# Patient Record
Sex: Female | Born: 1980 | Race: White | Hispanic: No | Marital: Married | Smoking: Current every day smoker
Health system: Southern US, Community
[De-identification: ages and names within clinical notes are randomized; demographics above are authoritative.]

---

## 2015-11-12 ENCOUNTER — Other Ambulatory Visit: Payer: Self-pay | Admitting: Orthopedic Surgery

## 2015-11-12 DIAGNOSIS — M4726 Other spondylosis with radiculopathy, lumbar region: Secondary | ICD-10-CM

## 2015-11-18 ENCOUNTER — Ambulatory Visit
Admission: RE | Admit: 2015-11-18 | Discharge: 2015-11-18 | Disposition: A | Discharge status: Home / Self Care | Payer: 59 | Source: Ambulatory Visit | Attending: Orthopedic Surgery | Admitting: Orthopedic Surgery

## 2015-11-18 DIAGNOSIS — M4726 Other spondylosis with radiculopathy, lumbar region: Secondary | ICD-10-CM

## 2018-10-07 ENCOUNTER — Other Ambulatory Visit: Payer: Self-pay | Admitting: Rehabilitation

## 2018-10-07 DIAGNOSIS — M5126 Other intervertebral disc displacement, lumbar region: Secondary | ICD-10-CM

## 2018-10-23 ENCOUNTER — Ambulatory Visit
Admission: RE | Admit: 2018-10-23 | Discharge: 2018-10-23 | Disposition: A | Discharge status: Home / Self Care | Payer: 59 | Source: Ambulatory Visit | Attending: Rehabilitation | Admitting: Rehabilitation

## 2018-10-23 DIAGNOSIS — M5126 Other intervertebral disc displacement, lumbar region: Secondary | ICD-10-CM

## 2020-03-22 ENCOUNTER — Other Ambulatory Visit: Payer: Self-pay | Admitting: Orthopaedic Surgery

## 2020-03-22 DIAGNOSIS — M47816 Spondylosis without myelopathy or radiculopathy, lumbar region: Secondary | ICD-10-CM

## 2020-04-21 ENCOUNTER — Ambulatory Visit
Admission: RE | Admit: 2020-04-21 | Discharge: 2020-04-21 | Disposition: A | Discharge status: Home / Self Care | Payer: 59 | Source: Ambulatory Visit | Attending: Orthopaedic Surgery | Admitting: Orthopaedic Surgery

## 2020-04-21 ENCOUNTER — Other Ambulatory Visit: Payer: Self-pay

## 2020-04-21 DIAGNOSIS — M47816 Spondylosis without myelopathy or radiculopathy, lumbar region: Secondary | ICD-10-CM

## 2022-04-13 IMAGING — MR MR LUMBAR SPINE W/O CM
4 of 5 series · 23 of 48 positions shown · non-contrast
Comparison: Prior MRI from 10/23/2018.

CLINICAL DATA: Initial evaluation for lower back pain radiating
into the left lower extremity with intermittent left buttock and
foot numbness.

EXAM:
MRI LUMBAR SPINE WITHOUT CONTRAST
TECHNIQUE: Multiplanar, multisequence MR imaging of the lumbar spine was
performed. No intravenous contrast was administered.

[Series 3: T2 post-contrast · sagittal · 4.0mm · 0.55mm/px · 7 of 13 slices shown]
[im 1/13]
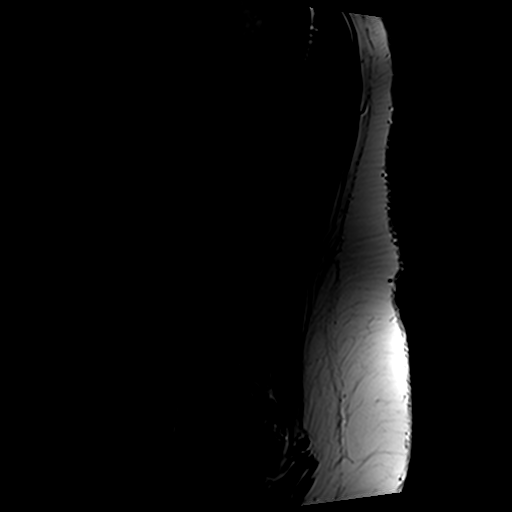
[im 3/13]
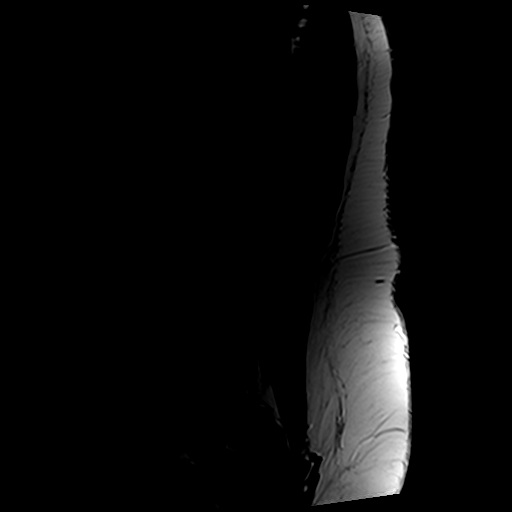
[im 5/13]
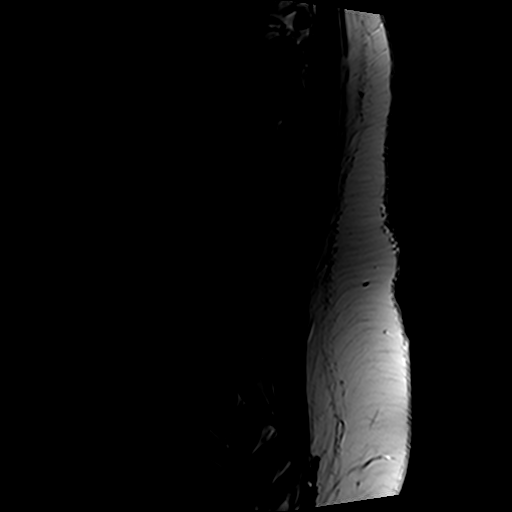
[im 7/13]
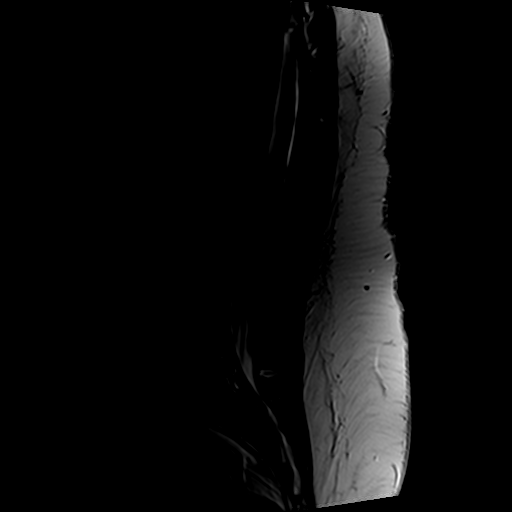
[im 9/13]
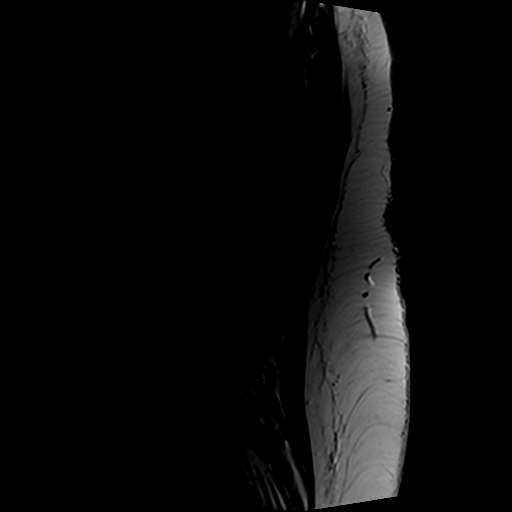
[im 11/13]
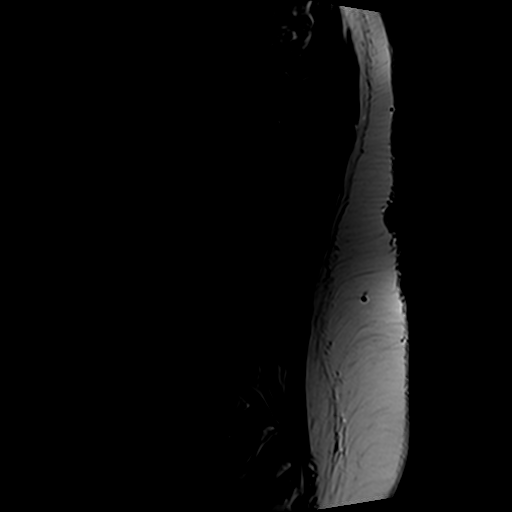
[im 13/13]
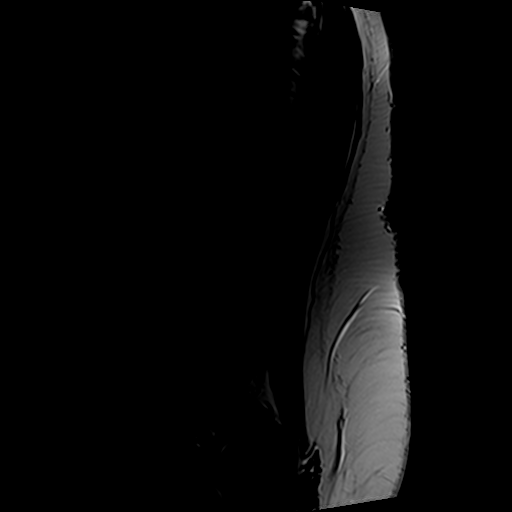

[Series 5: T1 · sagittal · 4.0mm · 0.55mm/px · 5 of 13 slices shown (1 of 2)]
[im 1/13]
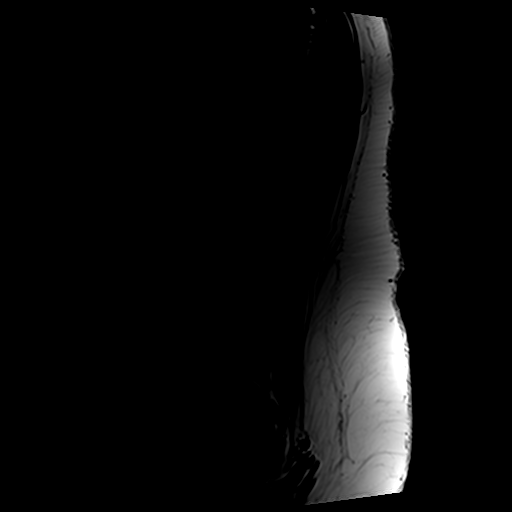
[im 3/13]
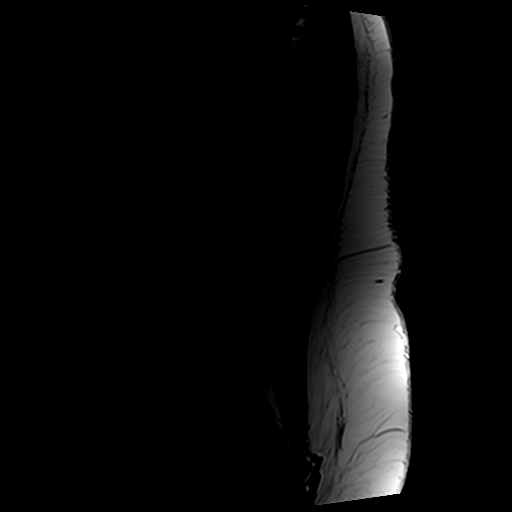
[im 5/13]
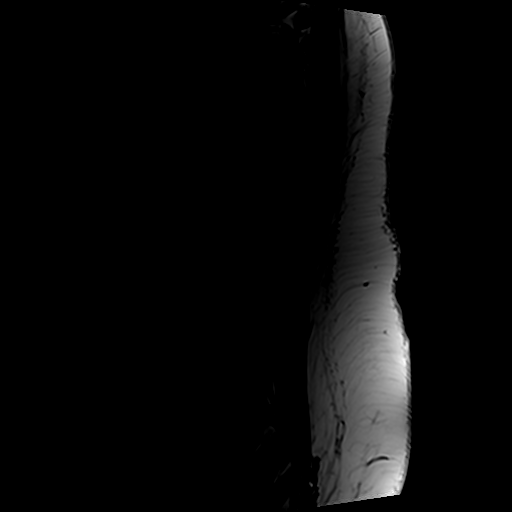
[im 8/13]
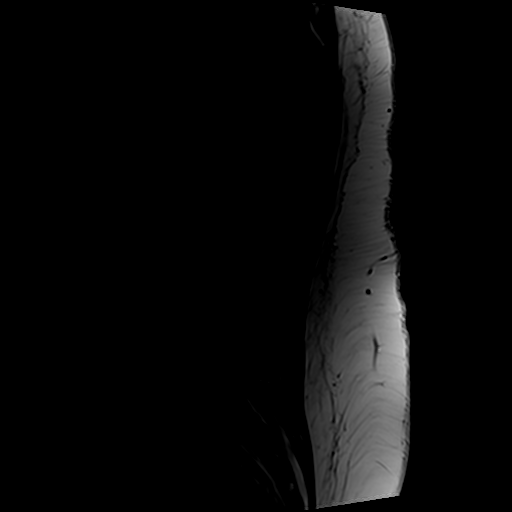
[im 13/13]
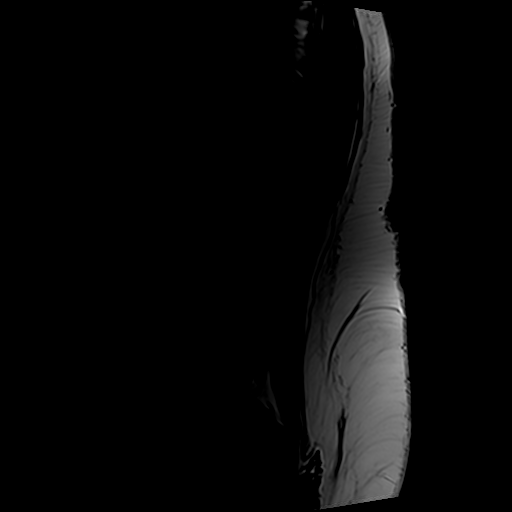

[Series 6: T1 · axial · 4.0mm · 0.35mm/px · z∈[-68,+46]mm · 3 of 29 slices shown (2 of 2)]
[im 5/29]
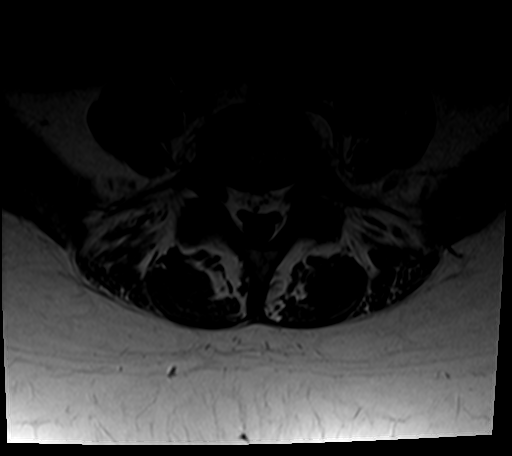
[im 16/29]
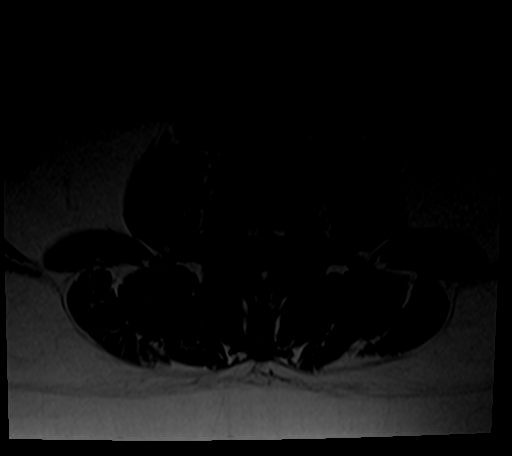
[im 24/29]
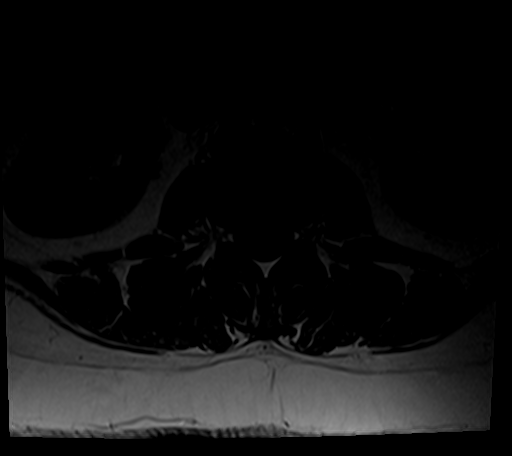

[Series 7: T2 · axial · 4.0mm · 0.70mm/px · z∈[-88,+79]mm · 8 of 29 slices shown]
[im 1/29]
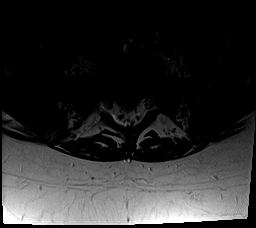
[im 5/29]
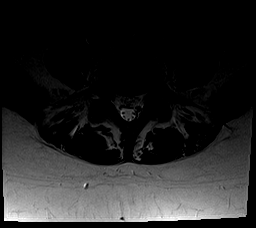
[im 9/29]
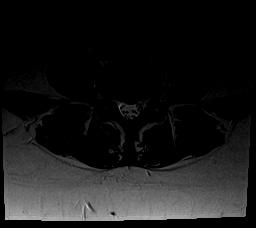
[im 13/29]
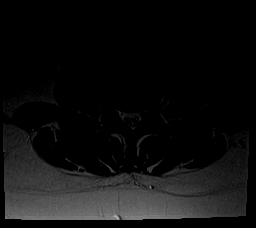
[im 16/29]
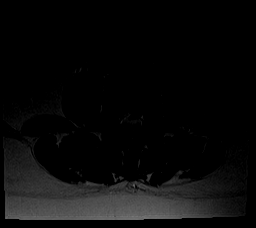
[im 20/29]
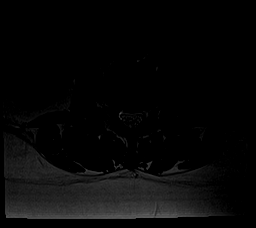
[im 24/29]
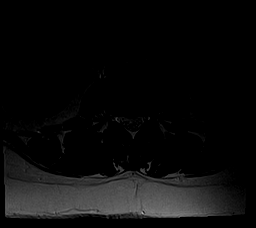
[im 29/29]
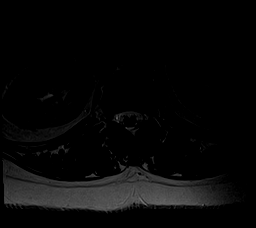

[23 of 48 positions shown; findings below may reference images not displayed]

FINDINGS: Segmentation: Standard. Lowest well-formed disc space labeled the
L5-S1 level.

Alignment: Trace retrolisthesis of L2 on L3, L3 on L4, and L4 on L5,
chronic and degenerative. Alignment otherwise normal with
preservation of the normal lumbar lordosis.

Vertebrae: Vertebral body height maintained without evidence for
acute or chronic fracture. Bone marrow signal intensity within
normal limits. Few scattered benign hemangiomata noted. No worrisome
osseous lesions. Prominent reactive endplate changes noted about the
L5-S1 interspace. No abnormal marrow edema.

Conus medullaris and cauda equina: Conus extends to the L1 level.
Conus and cauda equina appear normal.

Paraspinal and other soft tissues: Paraspinous soft tissues
demonstrate no acute finding. 2.6 cm heterogeneous T1/T2
hyperintense lesion seen within the left ovary (series 3, image 8),
indeterminate. Remainder the visualized visceral structures
otherwise unremarkable.

Disc levels:

L1-2: Diffuse disc bulge with disc desiccation. Superimposed small
central disc protrusion indents the ventral thecal sac. Associated
annular fissure. No significant spinal stenosis. Foramina remain
patent.

L2-3: Diffuse disc bulge with disc desiccation and intervertebral
disc space narrowing. Broad-based central disc protrusion indents
the ventral thecal sac. Borderline mild canal with right lateral
recess stenosis. Foramina remain patent.

L3-4: Diffuse disc bulge with disc desiccation. Shallow broad-based
central disc protrusion indents the ventral thecal sac, slightly
asymmetric to the right. Mild facet hypertrophy. Resultant mild
narrowing of the right lateral recess. Central canal remains patent.
Mild left L3 foraminal stenosis. No significant right foraminal
narrowing.

L4-5: Diffuse disc bulge with disc desiccation. Superimposed central
disc protrusion with annular fissure. Protruding disc closely
approximates the descending L5 nerve roots without frank neural
impingement. Mild bilateral facet hypertrophy. Resultant mild
narrowing of the lateral recesses. Mild to moderate bilateral L4
foraminal stenosis.

L5-S1: Chronic intervertebral disc space narrowing with diffuse disc
bulge and disc desiccation. Reactive endplate changes with marginal
endplate osteophytic spurring. Broad-based posterior disc bulge
closely approximates the descending S1 nerve roots without frank
neural impingement or displacement, slightly asymmetric to the left.
Mild facet hypertrophy. No significant spinal stenosis. Moderate
left worse than right L5 foraminal narrowing.
IMPRESSION: 1. Broad left eccentric disc bulge at L5-S1, closely approximating
the descending S1 nerve roots without frank neural impingement or
displacement.
2. Mild to moderate bilateral L4 and L5 foraminal stenosis related
to disc bulge and facet hypertrophy, greater on the left.
3. Additional small central disc protrusions at L1-2 through L4-5
without significant stenosis or neural impingement.
4. 2.6 cm heterogeneous T1/T2 hyperintense left adnexal lesion,
indeterminate. Further assessment with dedicated pelvic ultrasound
suggested for further evaluation.

## 2023-01-18 ENCOUNTER — Ambulatory Visit
Admission: RE | Admit: 2023-01-18 | Discharge: 2023-01-18 | Disposition: A | Discharge status: Home / Self Care | Payer: 59 | Source: Ambulatory Visit

## 2023-01-18 VITALS — BP 108/72 | HR 86 | Temp 99.9°F | Resp 18 | Ht <= 58 in | Wt 160.0 lb

## 2023-01-18 DIAGNOSIS — J111 Influenza due to unidentified influenza virus with other respiratory manifestations: Secondary | ICD-10-CM | POA: Diagnosis not present

## 2023-01-18 DIAGNOSIS — Z20828 Contact with and (suspected) exposure to other viral communicable diseases: Secondary | ICD-10-CM | POA: Diagnosis not present

## 2023-01-18 LAB — POCT INFLUENZA A/B
Influenza A, POC: NEGATIVE — AB
Influenza B, POC: NEGATIVE

## 2023-01-18 MED ORDER — PROMETHAZINE-DM 6.25-15 MG/5ML PO SYRP: 5.0000 mL | ORAL_SOLUTION | Freq: Four times a day (QID) | ORAL | 0 refills | Status: AC | PRN

## 2023-01-18 MED ORDER — OSELTAMIVIR PHOSPHATE 75 MG PO CAPS: 75.0000 mg | ORAL_CAPSULE | Freq: Two times a day (BID) | ORAL | 0 refills | Status: AC

## 2023-01-18 MED ORDER — IPRATROPIUM BROMIDE 0.06 % NA SOLN: 2.0000 | Freq: Four times a day (QID) | NASAL | 0 refills | Status: AC

## 2023-01-18 NOTE — ED Triage Notes (Signed)
Patient c/o possible influenza, son tested positive for influenza B on Thursday.  She woke up this morning with cough, body aches, congestion.  Patient has taken OTC cold meds.

## 2023-01-18 NOTE — Discharge Instructions (Signed)
I am treating you as if this is influenza even though your testing is negative.  Stop over-the-counter cold medicines, start Mucinex D, saline nasal irrigation with a Milta Deiters Med rinse and distilled water as often as you want, Atrovent nasal spray for the nasal congestion, Promethazine DM for the cough.  1000 mg of Tylenol 3 times a day.  Finish the Tamiflu, even if you feel better.

## 2023-01-18 NOTE — ED Provider Notes (Signed)
HPI  SUBJECTIVE:  Diana Jones is a 42 y.o. female who presents with fever Tmax 100.4, body aches, headaches, nasal congestion, rhinorrhea, sore throat, cough and chest soreness starting last night.  No postnasal drip, nausea,  vomiting, abdominal pain.  No known COVID exposure.  Her son currently has a lab confirmed case of influenza.  She did not get the COVID-vaccine.  She got this years flu vaccine.  She tried severe congestion/cough OTC medication and Tylenol without improvement in her symptoms.  Symptoms worse with being up. she has no past medical history, but cannot take NSAIDs.  LMP: Yesterday.  Denies possibility being pregnant. PCP: [facility] [place]   History reviewed. No pertinent past medical history.  History reviewed. No pertinent surgical history.  Family History  Problem Relation Age of Onset   Healthy Mother     Social History   Tobacco Use   Smoking status: Every Day    Types: Cigarettes   Smokeless tobacco: Never  Vaping Use   Vaping Use: Never used  Substance Use Topics   Alcohol use: Yes   Drug use: Never    No current facility-administered medications for this encounter.  Current Outpatient Medications:    gabapentin (NEURONTIN) 800 MG tablet, Take 800 mg by mouth 3 (three) times daily., Disp: , Rfl:    ipratropium (ATROVENT) 0.06 % nasal spray, Place 2 sprays into both nostrils 4 (four) times daily., Disp: 15 mL, Rfl: 0   methocarbamol (ROBAXIN) 750 MG tablet, Take 750 mg by mouth 3 (three) times daily as needed., Disp: , Rfl:    oseltamivir (TAMIFLU) 75 MG capsule, Take 1 capsule (75 mg total) by mouth 2 (two) times daily. X 5 days, Disp: 10 capsule, Rfl: 0   promethazine-dextromethorphan (PROMETHAZINE-DM) 6.25-15 MG/5ML syrup, Take 5 mLs by mouth 4 (four) times daily as needed for cough., Disp: 118 mL, Rfl: 0   Vitamin D, Ergocalciferol, (DRISDOL) 1.25 MG (50000 UNIT) CAPS capsule, Take 50,000 Units by mouth once a week., Disp: , Rfl:   Allergies   Allergen Reactions   Latex Rash, Other (See Comments) and Swelling    burning   Nsaids Other (See Comments)    Can tolerate Celebrex   Wound Dressing Adhesive Other (See Comments)    "burning" tolerates paper tape     ROS  As noted in HPI.   Physical Exam  BP 108/72 (BP Location: Right Arm)   Pulse 86   Temp 99.9 F (37.7 C) (Oral)   Resp 18   Ht 4\' 7"  (1.397 m)   Wt 72.6 kg   LMP 01/17/2023   SpO2 97%   BMI 37.19 kg/m   Constitutional: Well developed, well nourished, no acute distress Eyes:  EOMI, conjunctiva normal bilaterally HENT: Normocephalic, atraumatic,mucus membranes moist.  Positive nasal congestion.  Erythematous oropharynx, normal tonsil size without exudates.  Positive postnasal drip. Neck: No cervical lymphadenopathy Respiratory: Normal inspiratory effort, lungs clear bilaterally.  Positive anterior, lateral chest wall tenderness. Cardiovascular: Normal rate, regular rhythm, no murmurs rubs or gallops GI: nondistended skin: No rash, skin intact Musculoskeletal: no deformities Neurologic: Alert & oriented x 3, no focal neuro deficits Psychiatric: Speech and behavior appropriate   ED Course   Medications - No data to display  Orders Placed This Encounter  Procedures   POCT Influenza A/B    Standing Status:   Standing    Number of Occurrences:   1    Results for orders placed or performed during the hospital encounter  of 01/18/23 (from the past 24 hour(s))  POCT Influenza A/B     Status: Abnormal   Collection Time: 01/18/23  1:20 PM  Result Value Ref Range   Influenza A, POC Negative (A) Negative   Influenza B, POC Negative Negative   No results found.  ED Clinical Impression  1. Influenza   2. Exposure to influenza      ED Assessment/Plan     Influenza POC negative.  However, given significant exposure to flu and poor sensitivity of POC testing, will treat as influenza and send home with Tamiflu.  Saline nasal irrigation, Tylenol  1000 mg 3 times a day, states she cannot take ibuprofen due to "stomach issues".  Mucinex D, Promethazine DM, work note until Thursday  Discussed labs, MDM, treatment plan, and plan for follow-up with patient.  patient agrees with plan.   Meds ordered this encounter  Medications   promethazine-dextromethorphan (PROMETHAZINE-DM) 6.25-15 MG/5ML syrup    Sig: Take 5 mLs by mouth 4 (four) times daily as needed for cough.    Dispense:  118 mL    Refill:  0   oseltamivir (TAMIFLU) 75 MG capsule    Sig: Take 1 capsule (75 mg total) by mouth 2 (two) times daily. X 5 days    Dispense:  10 capsule    Refill:  0   ipratropium (ATROVENT) 0.06 % nasal spray    Sig: Place 2 sprays into both nostrils 4 (four) times daily.    Dispense:  15 mL    Refill:  0      *This clinic note was created using [employer]. Therefore, there may be occasional mistakes despite careful proofreading.  ? ]Atrovent nasal spray,   Melynda Ripple, MD 01/19/23 1108

## 2023-01-19 ENCOUNTER — Telehealth: Payer: Self-pay

## 2023-01-19 NOTE — Telephone Encounter (Signed)
TC to f/u with pt after yesterday's visit to [facility]. Pt reports she is feeling about the same. She is taking prescribed medication and has no questions or problems to discuss at this time.

## 2023-03-20 ENCOUNTER — Other Ambulatory Visit: Payer: Self-pay | Admitting: Internal Medicine

## 2023-03-20 DIAGNOSIS — M4726 Other spondylosis with radiculopathy, lumbar region: Secondary | ICD-10-CM

## 2023-03-20 DIAGNOSIS — M5431 Sciatica, right side: Secondary | ICD-10-CM

## 2023-03-20 DIAGNOSIS — G5711 Meralgia paresthetica, right lower limb: Secondary | ICD-10-CM

## 2023-03-20 DIAGNOSIS — M4326 Fusion of spine, lumbar region: Secondary | ICD-10-CM

## 2023-04-11 ENCOUNTER — Ambulatory Visit
Admission: RE | Admit: 2023-04-11 | Discharge: 2023-04-11 | Disposition: A | Discharge status: Home / Self Care | Payer: 59 | Source: Ambulatory Visit | Attending: *Deleted | Admitting: *Deleted

## 2023-04-11 DIAGNOSIS — G5711 Meralgia paresthetica, right lower limb: Secondary | ICD-10-CM

## 2023-04-11 DIAGNOSIS — M5431 Sciatica, right side: Secondary | ICD-10-CM

## 2023-04-11 DIAGNOSIS — M4726 Other spondylosis with radiculopathy, lumbar region: Secondary | ICD-10-CM

## 2023-04-11 DIAGNOSIS — M4326 Fusion of spine, lumbar region: Secondary | ICD-10-CM

## 2023-06-10 ENCOUNTER — Other Ambulatory Visit: Payer: Self-pay | Admitting: *Deleted

## 2023-06-10 DIAGNOSIS — M5104 Intervertebral disc disorders with myelopathy, thoracic region: Secondary | ICD-10-CM

## 2023-06-20 ENCOUNTER — Ambulatory Visit
Admission: RE | Admit: 2023-06-20 | Discharge: 2023-06-20 | Disposition: A | Discharge status: Home / Self Care | Payer: 59 | Source: Ambulatory Visit | Attending: *Deleted | Admitting: *Deleted

## 2023-06-20 DIAGNOSIS — M5104 Intervertebral disc disorders with myelopathy, thoracic region: Secondary | ICD-10-CM
